# Patient Record
Sex: Female | Born: 1945 | Race: White | Hispanic: No | Smoking: Former smoker
Health system: Southern US, Community
[De-identification: ages and names within clinical notes are randomized; demographics above are authoritative.]

## PROBLEM LIST (undated history)

## (undated) DIAGNOSIS — J449 Chronic obstructive pulmonary disease, unspecified: Secondary | ICD-10-CM

## (undated) DIAGNOSIS — C140 Malignant neoplasm of pharynx, unspecified: Secondary | ICD-10-CM

## (undated) DIAGNOSIS — Z5189 Encounter for other specified aftercare: Secondary | ICD-10-CM

## (undated) DIAGNOSIS — I10 Essential (primary) hypertension: Secondary | ICD-10-CM

## (undated) DIAGNOSIS — E119 Type 2 diabetes mellitus without complications: Secondary | ICD-10-CM

## (undated) HISTORY — PX: CHOLECYSTECTOMY: SHX55

## (undated) HISTORY — PX: SHOULDER SURGERY: SHX246

## (undated) HISTORY — PX: CATARACT EXTRACTION: SUR2

## (undated) HISTORY — PX: ABDOMINAL HYSTERECTOMY: SHX81

---

## 2015-11-04 ENCOUNTER — Encounter (HOSPITAL_BASED_OUTPATIENT_CLINIC_OR_DEPARTMENT_OTHER): Payer: Self-pay | Admitting: *Deleted

## 2015-11-04 ENCOUNTER — Emergency Department (HOSPITAL_BASED_OUTPATIENT_CLINIC_OR_DEPARTMENT_OTHER)
Admission: EM | Admit: 2015-11-04 | Discharge: 2015-11-04 | Disposition: A | Discharge status: Home / Self Care | Payer: Medicare Other | Attending: Emergency Medicine | Admitting: Emergency Medicine

## 2015-11-04 ENCOUNTER — Emergency Department (HOSPITAL_BASED_OUTPATIENT_CLINIC_OR_DEPARTMENT_OTHER): Payer: Medicare Other

## 2015-11-04 DIAGNOSIS — R079 Chest pain, unspecified: Secondary | ICD-10-CM | POA: Diagnosis present

## 2015-11-04 DIAGNOSIS — M436 Torticollis: Secondary | ICD-10-CM | POA: Diagnosis not present

## 2015-11-04 DIAGNOSIS — J449 Chronic obstructive pulmonary disease, unspecified: Secondary | ICD-10-CM | POA: Diagnosis not present

## 2015-11-04 DIAGNOSIS — Z7982 Long term (current) use of aspirin: Secondary | ICD-10-CM | POA: Diagnosis not present

## 2015-11-04 DIAGNOSIS — Z79899 Other long term (current) drug therapy: Secondary | ICD-10-CM | POA: Insufficient documentation

## 2015-11-04 DIAGNOSIS — Z85818 Personal history of malignant neoplasm of other sites of lip, oral cavity, and pharynx: Secondary | ICD-10-CM | POA: Diagnosis not present

## 2015-11-04 DIAGNOSIS — E119 Type 2 diabetes mellitus without complications: Secondary | ICD-10-CM | POA: Insufficient documentation

## 2015-11-04 DIAGNOSIS — Z87891 Personal history of nicotine dependence: Secondary | ICD-10-CM | POA: Insufficient documentation

## 2015-11-04 DIAGNOSIS — F419 Anxiety disorder, unspecified: Secondary | ICD-10-CM | POA: Insufficient documentation

## 2015-11-04 DIAGNOSIS — M542 Cervicalgia: Secondary | ICD-10-CM

## 2015-11-04 DIAGNOSIS — I10 Essential (primary) hypertension: Secondary | ICD-10-CM | POA: Insufficient documentation

## 2015-11-04 DIAGNOSIS — G44039 Episodic paroxysmal hemicrania, not intractable: Secondary | ICD-10-CM | POA: Diagnosis not present

## 2015-11-04 DIAGNOSIS — R1110 Vomiting, unspecified: Secondary | ICD-10-CM | POA: Diagnosis not present

## 2015-11-04 HISTORY — DX: Essential (primary) hypertension: I10

## 2015-11-04 HISTORY — DX: Chronic obstructive pulmonary disease, unspecified: J44.9

## 2015-11-04 HISTORY — DX: Encounter for other specified aftercare: Z51.89

## 2015-11-04 HISTORY — DX: Malignant neoplasm of pharynx, unspecified: C14.0

## 2015-11-04 HISTORY — DX: Type 2 diabetes mellitus without complications: E11.9

## 2015-11-04 LAB — BASIC METABOLIC PANEL
ANION GAP: 9 (ref 5–15)
BUN: 13 mg/dL (ref 6–20)
CHLORIDE: 104 mmol/L (ref 101–111)
CO2: 28 mmol/L (ref 22–32)
Calcium: 9.5 mg/dL (ref 8.9–10.3)
Creatinine, Ser: 0.87 mg/dL (ref 0.44–1.00)
GFR calc non Af Amer: >60 mL/min (ref >60)
Glucose, Bld: 104 mg/dL — ABNORMAL HIGH (ref 65–99)
POTASSIUM: 3.6 mmol/L (ref 3.5–5.1)
SODIUM: 141 mmol/L (ref 135–145)

## 2015-11-04 LAB — TROPONIN I: Troponin I: <0.03 ng/mL (ref <0.031)

## 2015-11-04 LAB — CBC
HEMATOCRIT: 35.9 % — AB (ref 36.0–46.0)
HEMOGLOBIN: 11.5 g/dL — AB (ref 12.0–15.0)
MCH: 26.7 pg (ref 26.0–34.0)
MCHC: 32 g/dL (ref 30.0–36.0)
MCV: 83.3 fL (ref 78.0–100.0)
Platelets: 121 10*3/uL — ABNORMAL LOW (ref 150–400)
RBC: 4.31 MIL/uL (ref 3.87–5.11)
RDW: 14.6 % (ref 11.5–15.5)
WBC: 5 10*3/uL (ref 4.0–10.5)

## 2015-11-04 LAB — CBG MONITORING, ED: Glucose-Capillary: 96 mg/dL (ref 65–99)

## 2015-11-04 LAB — D-DIMER, QUANTITATIVE (NOT AT ARMC): D DIMER QUANT: 0.37 ug{FEU}/mL (ref 0.00–0.50)

## 2015-11-04 LAB — SEDIMENTATION RATE: SED RATE: 34 mm/h — AB (ref 0–22)

## 2015-11-04 MED ORDER — ONDANSETRON HCL 4 MG/2ML IJ SOLN
4.0000 mg | Freq: Once | INTRAMUSCULAR | Status: AC
  Administered 2015-11-04: 4 mg via INTRAVENOUS
  Filled 2015-11-04: qty 2

## 2015-11-04 MED ORDER — MORPHINE SULFATE (PF) 4 MG/ML IV SOLN
4.0000 mg | Freq: Once | INTRAVENOUS | Status: AC
  Administered 2015-11-04: 4 mg via INTRAVENOUS
  Filled 2015-11-04: qty 1

## 2015-11-04 MED ORDER — NITROGLYCERIN 0.4 MG SL SUBL
0.4000 mg | SUBLINGUAL_TABLET | SUBLINGUAL | Status: DC | PRN
  Administered 2015-11-04: 0.4 mg via SUBLINGUAL
  Filled 2015-11-04: qty 1

## 2015-11-04 MED ORDER — METHOCARBAMOL 500 MG PO TABS: 500.0000 mg | ORAL_TABLET | Freq: Two times a day (BID) | ORAL | Status: AC

## 2015-11-04 MED ORDER — HYDROCODONE-ACETAMINOPHEN 5-325 MG PO TABS: 1.0000 | ORAL_TABLET | ORAL | Status: AC | PRN

## 2015-11-04 MED ORDER — IOHEXOL 350 MG/ML SOLN
100.0000 mL | Freq: Once | INTRAVENOUS | Status: AC | PRN
  Administered 2015-11-04: 100 mL via INTRAVENOUS

## 2015-11-04 MED ORDER — ASPIRIN 325 MG PO TABS
325.0000 mg | ORAL_TABLET | Freq: Every day | ORAL | Status: DC
  Administered 2015-11-04: 325 mg via ORAL
  Filled 2015-11-04: qty 1

## 2015-11-04 NOTE — ED Notes (Signed)
MD at bedside. 

## 2015-11-04 NOTE — Discharge Instructions (Signed)
Return to the ED with any concerns including difficulty breathing, worsening chest pain, fainting, weakness of arms, worsening headache, visual changes, vomiting, decreased level of alertness/lethargy, or any other alarming symptoms    Tiny left lung nodules may be granulomata. CT chest without contrast recommended after resolution of acute symptoms to further evaluate.   You should also follow up with your doctor to have your Sedimentation Rate rechecked (ESR)- this was somewhat elevated tonight in the ED at 34.

## 2015-11-04 NOTE — ED Notes (Signed)
Pt d/c home with family- Follow up care discussed. Rx x 2 given for hydrocodone and robaxin

## 2015-11-04 NOTE — ED Provider Notes (Signed)
CSN: [id number]     Arrival date & time 11/04/15  1524 History   First MD Initiated Contact with Patient 11/04/15 1601     Chief Complaint  Patient presents with  . Chest Pain  . Back Pain     (Consider location/radiation/quality/duration/timing/severity/associated sxs/prior Treatment) HPI  Pt presenting with c/o intermittent chest pain and back pain over the past 3 days.  Also c/o pain in right neck.  Pt states today the pain was so severe that she had an episode of emesis today.  Pain started on the day that her sister-in-law died and was worse today during the funeral.  She also describes pain in right sided of head at her temple that is sharp and severe- brining tears to her eyes.  She states this has been going on for the past month  Past Medical History  Diagnosis Date  . Diabetes mellitus without complication ([place])   . Hypertension   . COPD (chronic obstructive pulmonary disease) ([place])   . Throat cancer ([place])   . Blood transfusion without reported diagnosis    Past Surgical History  Procedure Laterality Date  . Shoulder surgery    . Cholecystectomy    . Abdominal hysterectomy    . Cataract extraction     No family history on file. Social History  Substance Use Topics  . Smoking status: Former Research scientist (life sciences)  . Smokeless tobacco: Never Used  . Alcohol Use: No   OB History    No data available     Review of Systems  ROS reviewed and all otherwise negative except for mentioned in HPI    Allergies  Review of patient's allergies indicates no known allergies.  Home Medications   Prior to Admission medications   Medication Sig Start Date End Date Taking? Authorizing Provider  albuterol (PROVENTIL HFA;VENTOLIN HFA) 108 (90 BASE) MCG/ACT inhaler Inhale 2 puffs into the lungs every 6 (six) hours as needed for wheezing or shortness of breath.   Yes Historical Provider, MD  aspirin 81 MG tablet Take 81 mg by mouth daily.   Yes Historical Provider, MD  cholestyramine  Lucrezia Starch) 4 G packet Take 4 g by mouth 2 (two) times daily.   Yes Historical Provider, MD  citalopram (CELEXA) 20 MG tablet Take 20 mg by mouth daily.   Yes Historical Provider, MD  clonazePAM (KLONOPIN) 0.5 MG tablet Take 0.5 mg by mouth 3 (three) times daily as needed for anxiety.   Yes Historical Provider, MD  gabapentin (NEURONTIN) 300 MG capsule Take 300 mg by mouth at bedtime.   Yes Historical Provider, MD  loperamide (IMODIUM) 2 MG capsule Take by mouth 2 (two) times daily.   Yes Historical Provider, MD  VALERIAN ROOT PO Take by mouth.   Yes Historical Provider, MD  HYDROcodone-acetaminophen (NORCO/VICODIN) 5-325 MG tablet Take 1 tablet by mouth every 4 (four) hours as needed. 11/04/15   Alfonzo Beers, MD  methocarbamol (ROBAXIN) 500 MG tablet Take 1 tablet (500 mg total) by mouth 2 (two) times daily. 11/04/15   Alfonzo Beers, MD   BP 121/77 mmHg  Pulse 71  Temp(Src) 98.3 F (36.8 C) (Oral)  Resp 15  SpO2 100%  Vitals reviewed Physical Exam  Physical Examination: General appearance - alert, anxious appearing, and in no distress Mental status - alert, oriented to person, place, and time Eyes - pupils equal and reactive, extraocular eye movements intact Mouth - mucous membranes moist, pharynx normal without lesions Neck - supple, no significant adenopathy, ttp over right  SCM, 2+ carotid pulses without bruits, pain with ROM to left sided, no meningismus Chest - clear to auscultation, no wheezes, rales or rhonchi, symmetric air entry Heart - normal rate, regular rhythm, normal S1, S2, no murmurs, rubs, clicks or gallops Abdomen - soft, nontender, nondistended, no masses or organomegaly Neurological - alert, oriented, normal speech, cranial nerves 2-12 tested and intact, strength 5/5 in extremities x 4, sensation intact Extremities - peripheral pulses normal, no pedal edema, no clubbing or cyanosis Skin - normal coloration and turgor, no rashes  ED Course  Procedures (including  critical care time) Labs Review Labs Reviewed  BASIC METABOLIC PANEL - Abnormal; Notable for the following:    Glucose, Bld 104 (*)    All other components within normal limits  CBC - Abnormal; Notable for the following:    Hemoglobin 11.5 (*)    HCT 35.9 (*)    Platelets 121 (*)    All other components within normal limits  SEDIMENTATION RATE - Abnormal; Notable for the following:    Sed Rate 34 (*)    All other components within normal limits  TROPONIN I  D-DIMER, QUANTITATIVE (NOT AT [facility])  TROPONIN I  CBG MONITORING, ED    Imaging Review Ct Angio Head W/cm &/or Wo Cm  11/04/2015  CLINICAL DATA:  Right-sided neck pain with headache EXAM: CT ANGIOGRAPHY HEAD AND NECK TECHNIQUE: Multidetector CT imaging of the head and neck was performed using the standard protocol during bolus administration of intravenous contrast. Multiplanar CT image reconstructions and MIPs were obtained to evaluate the vascular anatomy. Carotid stenosis measurements (when applicable) are obtained utilizing NASCET criteria, using the distal internal carotid diameter as the denominator. CONTRAST:  17m OMNIPAQUE IOHEXOL 350 MG/ML SOLN COMPARISON:  None. FINDINGS: CT HEAD Brain: Ventricle size is normal. Negative for acute or chronic infarction. Negative for hemorrhage or mass lesion. Calvarium and skull base: Negative Paranasal sinuses: Negative Orbits: Negative CTA NECK Aortic arch: Mild atherosclerotic disease in the aortic arch without aneurysm or dissection. Proximal great vessels widely patent. Multiple calcified lymph nodes in the mediastinum and hilum bilaterally without enlarged lymph nodes or mass. Calcified granulomata in the lung apices bilaterally. Apical scarring bilaterally. Right carotid system: Right common carotid artery widely patent. Mild atherosclerotic calcification in the carotid bulb without significant stenosis or dissection. Remainder of the right internal carotid artery appears normal. External  carotid artery widely patent Left carotid system: Left common carotid artery is widely patent and tortuous. Mild atherosclerotic disease at the origin. Left carotid bifurcation appears normal without atherosclerotic disease or dissection. Vertebral arteries:Left vertebral dominant. Right vertebral artery ends in PICA. No significant vertebral artery stenosis. Skeleton: Mild cervical degenerative change. No acute bony abnormality. Other neck: Negative for mass lesion in the neck. CTA HEAD Anterior circulation: Cavernous carotid widely patent bilaterally without significant atherosclerotic disease or stenosis. Anterior and middle cerebral arteries widely patent bilaterally. Fetal origin right posterior cerebral artery Posterior circulation: Left vertebral artery dominant and widely patent to the basilar. Right vertebral artery ends in PICA. PICA patent bilaterally. Basilar is hypoplastic but patent. Fetal origin right posterior cerebral artery with hypoplastic right P1 segment. Both posterior cerebral arteries are patent. Venous sinuses: Patent Anatomic variants: Negative for cerebral aneurysm Delayed phase: Normal enhancement following contrast administration. No enhancing mass lesion. IMPRESSION: No significant carotid or vertebral artery stenosis. Negative for dissection. Minimal atherosclerotic disease. No significant intracranial abnormality. Electronically Signed   By: CFranchot GalloM.D.   On: 11/04/2015 19:02   Dg  Chest 2 View  11/04/2015  CLINICAL DATA:  Initial encounter for intermittent chest and back pain since Tuesday with vomiting today. EXAM: CHEST  2 VIEW COMPARISON:  None. FINDINGS: Lung volumes are low with basilar atelectasis. Mild vascular congestion without overt edema. Scattered tiny nodules in the left lung may be granulomata given their visibility relative to tiny size. The cardio pericardial silhouette is enlarged. Imaged bony structures of the thorax are intact. Telemetry leads overlie  the chest. IMPRESSION: Low volume film with cardiomegaly and vascular congestion. Bibasilar atelectasis. Tiny left lung nodules may be granulomata. CT chest without contrast recommended after resolution of acute symptoms to further evaluate. Electronically Signed   By: Misty Stanley M.D.   On: 11/04/2015 16:58   Ct Angio Neck W/cm &/or Wo/cm  11/04/2015  CLINICAL DATA:  Right-sided neck pain with headache EXAM: CT ANGIOGRAPHY HEAD AND NECK TECHNIQUE: Multidetector CT imaging of the head and neck was performed using the standard protocol during bolus administration of intravenous contrast. Multiplanar CT image reconstructions and MIPs were obtained to evaluate the vascular anatomy. Carotid stenosis measurements (when applicable) are obtained utilizing NASCET criteria, using the distal internal carotid diameter as the denominator. CONTRAST:  135m OMNIPAQUE IOHEXOL 350 MG/ML SOLN COMPARISON:  None. FINDINGS: CT HEAD Brain: Ventricle size is normal. Negative for acute or chronic infarction. Negative for hemorrhage or mass lesion. Calvarium and skull base: Negative Paranasal sinuses: Negative Orbits: Negative CTA NECK Aortic arch: Mild atherosclerotic disease in the aortic arch without aneurysm or dissection. Proximal great vessels widely patent. Multiple calcified lymph nodes in the mediastinum and hilum bilaterally without enlarged lymph nodes or mass. Calcified granulomata in the lung apices bilaterally. Apical scarring bilaterally. Right carotid system: Right common carotid artery widely patent. Mild atherosclerotic calcification in the carotid bulb without significant stenosis or dissection. Remainder of the right internal carotid artery appears normal. External carotid artery widely patent Left carotid system: Left common carotid artery is widely patent and tortuous. Mild atherosclerotic disease at the origin. Left carotid bifurcation appears normal without atherosclerotic disease or dissection. Vertebral  arteries:Left vertebral dominant. Right vertebral artery ends in PICA. No significant vertebral artery stenosis. Skeleton: Mild cervical degenerative change. No acute bony abnormality. Other neck: Negative for mass lesion in the neck. CTA HEAD Anterior circulation: Cavernous carotid widely patent bilaterally without significant atherosclerotic disease or stenosis. Anterior and middle cerebral arteries widely patent bilaterally. Fetal origin right posterior cerebral artery Posterior circulation: Left vertebral artery dominant and widely patent to the basilar. Right vertebral artery ends in PICA. PICA patent bilaterally. Basilar is hypoplastic but patent. Fetal origin right posterior cerebral artery with hypoplastic right P1 segment. Both posterior cerebral arteries are patent. Venous sinuses: Patent Anatomic variants: Negative for cerebral aneurysm Delayed phase: Normal enhancement following contrast administration. No enhancing mass lesion. IMPRESSION: No significant carotid or vertebral artery stenosis. Negative for dissection. Minimal atherosclerotic disease. No significant intracranial abnormality. Electronically Signed   By: CFranchot GalloM.D.   On: 11/04/2015 19:02   I have personally reviewed and evaluated these images and lab results as part of my medical decision-making.   EKG Interpretation   Date/Time:  Friday November 04 2015 15:38:08 EST Ventricular Rate:  101 PR Interval:  128 QRS Duration: 80 QT Interval:  350 QTC Calculation: 453 R Axis:   25 Text Interpretation:  Sinus tachycardia Low voltage QRS Cannot rule out  Anterior infarct , age undetermined Abnormal ECG No old tracing to compare  Confirmed by [facility] MD, Culley Hedeen [phone]  on 11/04/2015 4:02:11 PM      MDM   Final diagnoses:  Nonintractable paroxysmal hemicrania, unspecified chronicity pattern  Torticollis  Chest pain, unspecified chest pain type    Pt presenting with right sided neck pain, chest and back pain and  right sided headache.  Symptoms began several days ago and worsened today at a funeral.  On exam neck pain is most c/w toriticollis due to ttp over SCM and preference of holding head to the right side.  Pt does have ttp over temporal artery on right side.  Chest pain workup is reassuring including 2 sets of negative troponins in the setting of constant chest pain for over 6 hours.   D-dimer negative making her low risk for PE.  Doubt aortic dissection.  Ct angio of neck does not show evidence of carotid dissection or other acute emergent process.    8:37 PM d/w Dr. Janann Colonel about right sided headache and neck pain, elevated ESR at 34.  CT angio does not show any acute process or dissection in the neck.  He advised not to treat for temporal arteritis- she should have ESR rechecked in the next 1-2 weeks, also be rechecked sooner if she has visual changes associated.    Discharged with strict return precautions.  Pt agreeable with plan.  Alfonzo Beers, MD 11/06/15 [id number]

## 2015-11-04 NOTE — ED Notes (Signed)
Reports intermittent chest and back pain since Mar 20, 2023- reports feeling like she is going to pass out at times- states she vomited today during an episode of pain- reports pain started Mar 20, 2023 after death of her sister-in-law and funeral was today

## 2015-11-04 NOTE — ED Notes (Signed)
Pt states she started with severe chest pain this am radiating into her back and right neck. Pt states neck pain is worse now than before. Pt describes back pain as tearing pain. Pt flushed and states she feels SOB.

## 2016-11-25 IMAGING — CT CT ANGIO NECK
3 of 9 series · 9 of 35 positions shown · IV contrast (APPLIED)
Comparison: None.

CLINICAL DATA: Right-sided neck pain with headache

EXAM:
CT ANGIOGRAPHY HEAD AND NECK
TECHNIQUE: Multidetector CT imaging of the head and neck was performed using
the standard protocol during bolus administration of intravenous
contrast. Multiplanar CT image reconstructions and MIPs were
obtained to evaluate the vascular anatomy. Carotid stenosis
measurements (when applicable) are obtained utilizing NASCET
criteria, using the distal internal carotid diameter as the
denominator.
CONTRAST:  100mL OMNIPAQUE IOHEXOL 350 MG/ML SOLN

[Series 5: angio 2.0 b26f · axial · 0.74mm/px · z∈[-228,-108]mm · 2 of 180 slices shown]
[im 60/180  soft-tissue]
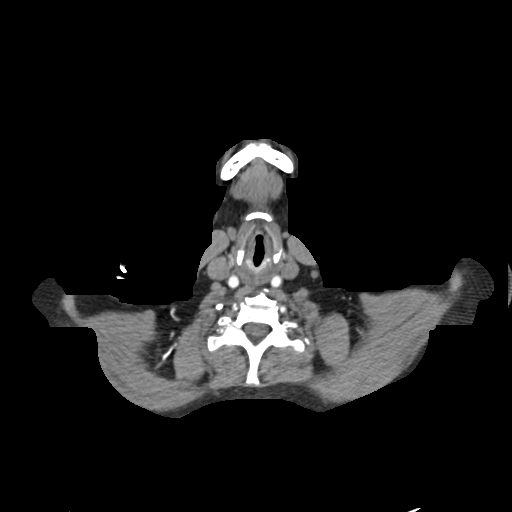
[im 120/180  bone]
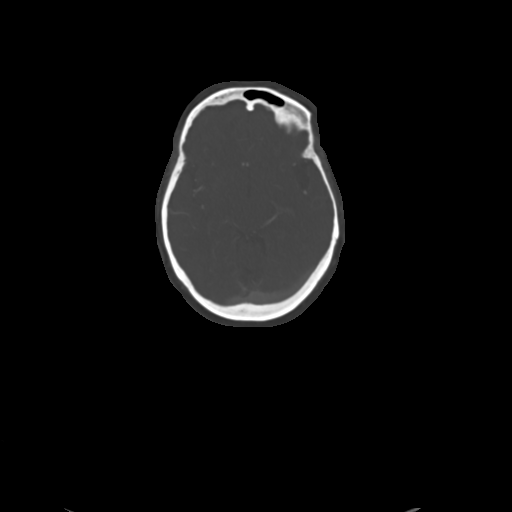

[Series 8: sag thin · sagittal · 0.61mm/px · 2 of 269 slices shown]
[im 54/269  soft-tissue]
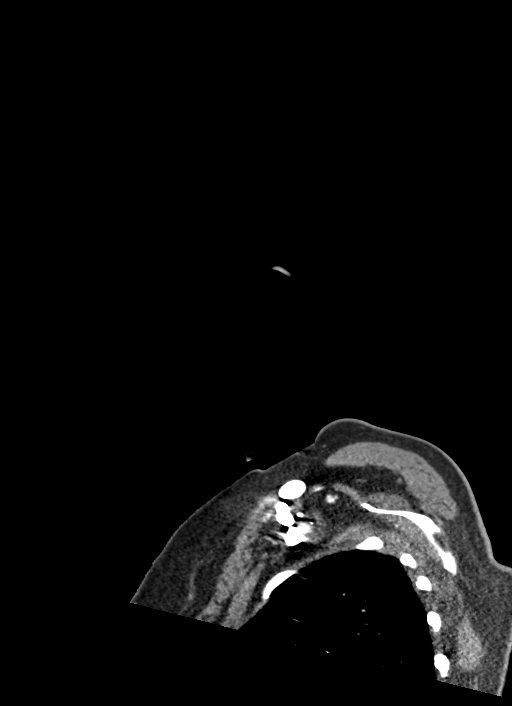
[im 216/269  soft-tissue]
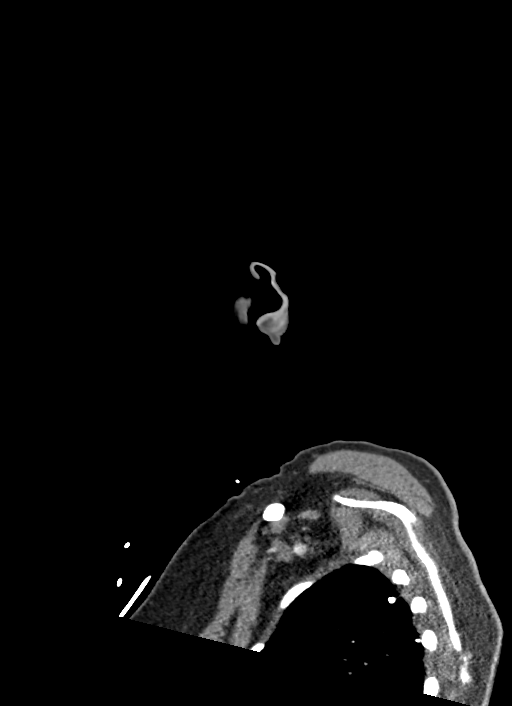

[Series 14: ax thin · axial · 0.57mm/px · z∈[-343,-127]mm · 5 of 335 slices shown]
[im 56/335  soft-tissue]
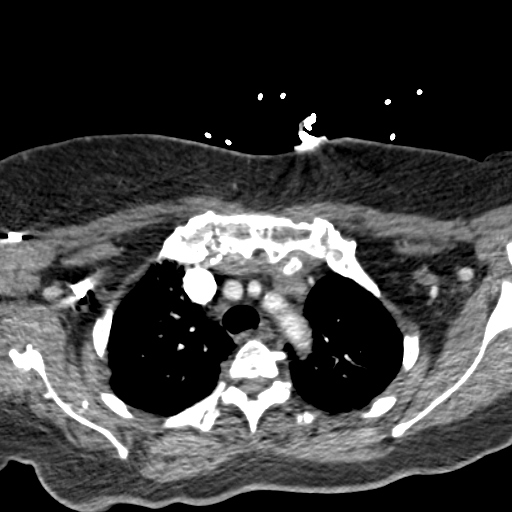
[im 112/335  soft-tissue]
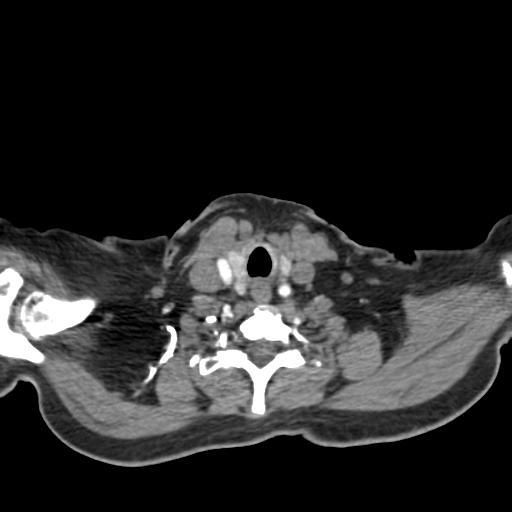
[im 168/335  soft-tissue]
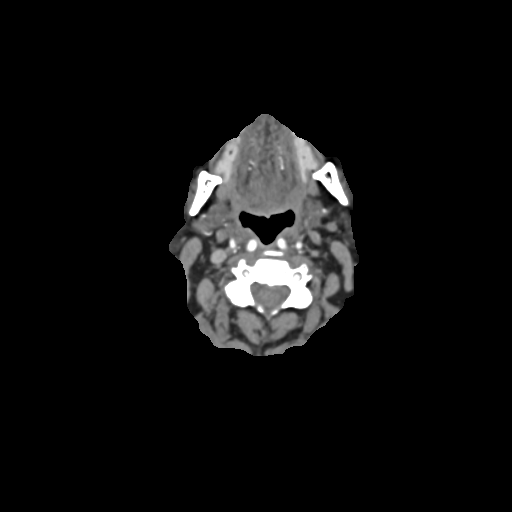
[im 223/335  soft-tissue]
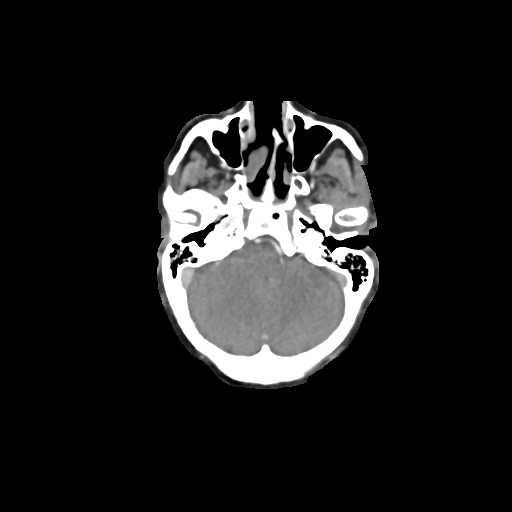
[im 279/335  soft-tissue]
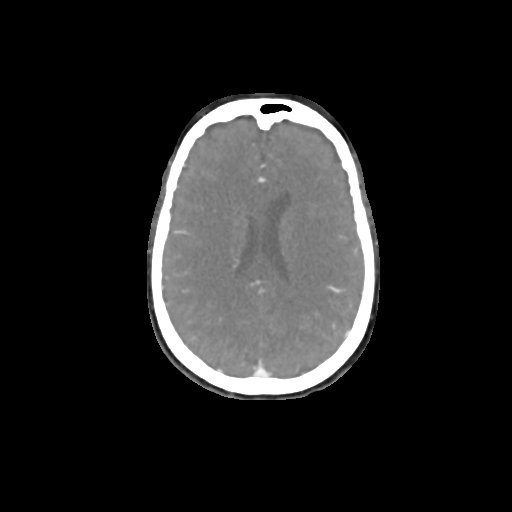

[9 of 35 positions shown; findings below may reference images not displayed]

FINDINGS: CT HEAD

Brain: Ventricle size is normal. Negative for acute or chronic
infarction. Negative for hemorrhage or mass lesion.

Calvarium and skull base: Negative

Paranasal sinuses: Negative

Orbits: Negative

CTA NECK

Aortic arch: Mild atherosclerotic disease in the aortic arch without
aneurysm or dissection. Proximal great vessels widely patent.
Multiple calcified lymph nodes in the mediastinum and hilum
bilaterally without enlarged lymph nodes or mass. Calcified
granulomata in the lung apices bilaterally. Apical scarring
bilaterally.

Right carotid system: Right common carotid artery widely patent.
Mild atherosclerotic calcification in the carotid bulb without
significant stenosis or dissection. Remainder of the right internal
carotid artery appears normal. External carotid artery widely patent

Left carotid system: Left common carotid artery is widely patent and
tortuous. Mild atherosclerotic disease at the origin. Left carotid
bifurcation appears normal without atherosclerotic disease or
dissection.

Vertebral arteries:Left vertebral dominant. Right vertebral artery
ends in PICA. No significant vertebral artery stenosis.

Skeleton: Mild cervical degenerative change. No acute bony
abnormality.

Other neck: Negative for mass lesion in the neck.

CTA HEAD

Anterior circulation: Cavernous carotid widely patent bilaterally
without significant atherosclerotic disease or stenosis. Anterior
and middle cerebral arteries widely patent bilaterally. Fetal origin
right posterior cerebral artery

Posterior circulation: Left vertebral artery dominant and widely
patent to the basilar. Right vertebral artery ends in PICA. PICA
patent bilaterally. Basilar is hypoplastic but patent. Fetal origin
right posterior cerebral artery with hypoplastic right P1 segment.
Both posterior cerebral arteries are patent.

Venous sinuses: Patent

Anatomic variants: Negative for cerebral aneurysm

Delayed phase: Normal enhancement following contrast administration.
No enhancing mass lesion.
IMPRESSION: No significant carotid or vertebral artery stenosis. Negative for
dissection. Minimal atherosclerotic disease.

No significant intracranial abnormality.

## 2022-04-16 DEATH — deceased
